# Patient Record
Sex: Female | Born: 1945 | Race: White | Hispanic: No | Smoking: Never smoker
Health system: Southern US, Community
[De-identification: ages and names within clinical notes are randomized; demographics above are authoritative.]

## PROBLEM LIST (undated history)

## (undated) HISTORY — PX: ABDOMINAL HYSTERECTOMY: SHX81

## (undated) HISTORY — PX: APPENDECTOMY: SHX54

## (undated) HISTORY — PX: HERNIA REPAIR: SHX51

---

## 2010-04-30 ENCOUNTER — Inpatient Hospital Stay (HOSPITAL_COMMUNITY)
Admission: EM | Admit: 2010-04-30 | Discharge: 2010-05-07 | Payer: Self-pay | Source: Home / Self Care | Admitting: Emergency Medicine

## 2010-05-01 ENCOUNTER — Ambulatory Visit: Payer: Self-pay | Admitting: Internal Medicine

## 2010-05-01 ENCOUNTER — Ambulatory Visit: Payer: Self-pay | Admitting: Gastroenterology

## 2010-05-02 ENCOUNTER — Ambulatory Visit: Payer: Self-pay | Admitting: Gastroenterology

## 2010-05-11 ENCOUNTER — Encounter (INDEPENDENT_AMBULATORY_CARE_PROVIDER_SITE_OTHER): Payer: Self-pay | Admitting: *Deleted

## 2010-08-04 NOTE — Letter (Signed)
Summary: RAD DG UGI W/KUB  RAD DG UGI W/KUB   Imported By: Rexene Alberts 05/01/2010 16:07:30  _____________________________________________________________________  External Attachment:    Type:   Image     Comment:   External Document  Appended Document: RAD DG UGI W/KUB pt aware; surgery consult obtained - in pt

## 2010-08-04 NOTE — Miscellaneous (Signed)
Summary: CONSULTATION  Clinical Lists Changes  NAME:  Denise Pham, Denise Pham               ACCOUNT NO.:  [id number]      MEDICAL RECORD NO.:  [id number]          PATIENT TYPE:  INP      LOCATION:  A334                          FACILITY:  [facility]      PHYSICIAN:  R. Roetta Sessions, M.D. DATE OF BIRTH:  [birth year]      DATE OF CONSULTATION:  05/01/2010   DATE OF DISCHARGE:                                    CONSULTATION         REASON FOR CONSULTATION:  Nausea, vomiting, and hematemesis.      PRIMARY CARE PHYSICIAN:  Dr. Fara Chute      HISTORY OF PRESENT ILLNESS:  The patient is very pleasant 65 year old   lady who presented to the emergency department here at [facility] with complaints of protracted nausea and vomiting and coffee-   ground emesis.  She really has no significant past medical history.   Wednesday she was eating out at a restaurant with her daughter and had   some salad.  When she started eating the pizza, she had an episode of   nausea and vomiting.  She excused herself to the bathroom.  She tried to   return and continue eating, but the vomiting persisted.  She went home   and because of persistent symptoms, she actually presented to [facility] late Wednesday evening.  According to her daughter,   who provides much of the history due to the patient being acutely ill,   she had a CT of the abdomen and pelvis which showed a hiatal hernia, but   nothing other significant.  She was given IV antiemetics and pain   medication and sent home.  Her symptoms were controlled for a few hours,   but she began having protracted abdominal pain, nausea, and vomiting   again.  Yesterday afternoon she started having coffee-ground emesis   which was described as profuse watery black emesis.  Her daughter   brought her back to the emergency department here at [facility]   around 4:30 yesterday afternoon.  She describes having pain   retrosternally.  It is  above the xiphoid process.  She has had   significant coffee-ground emesis this morning.  In the past, she has had   episodes of vomiting up food after the first couple of bites, but has   been able to complete her meal.  She really denies having any   significant problems over the last few years, however.  The last few   days, she has had severe GERD.  On occasion she takes Tums as an   outpatient.  She denies any melena or rectal bleeding.  She has not had   a bowel movement in a couple of days.  Generally has a daily BM.  Denies   any fever or chills.  No one else became ill.      OUTPATIENT MEDICATIONS:  Denies any chronic meds.  She was given   something for pain and nausea  at the ED at [facility].      ALLERGIES:  No known drug allergies.      PAST MEDICAL HISTORY:  Hiatal hernia.  No prior EGD or colonoscopy.      PAST SURGICAL HISTORY:  Appendectomy at age 65, hysterectomy in 1989.      FAMILY HISTORY:  Mother is deceased, had history of COPD and CAD.   Father is living, has history of A-fib and CHF.      SOCIAL HISTORY:  She is widowed.  She is a retired Emergency planning/management officer for   [employer].  She has 1 daughter.  She denies any tobacco or alcohol use.      REVIEW OF SYSTEMS:  See HPI for GI.  CONSTITUTIONAL:  No significant   weight loss.  CARDIOPULMONARY:  No chest pain, shortness of breath,   palpitations, or cough.  GENITOURINARY:  No dysuria or hematuria.      PHYSICAL EXAMINATION:  VITAL SIGNS:  Temperature 98.8, pulse 83,   respirations 18, blood pressure 166/80, weight 78.8 kg, height 60   inches.   GENERAL:  Pleasant, acutely ill-appearing Caucasian female in no acute   distress.  She is accompanied by her daughter.  She is able to answer   some questions, but is significantly nauseated and refrains from long   responses.   HEENT:  Sclerae are nonicteric.  Oropharyngeal mucosa dry.   CHEST:  Lungs are clear auscultation.   CARDIAC:  Reveals a regular rate  and rhythm.  No murmurs, rubs, or   gallops.   ABDOMEN:  Obese, positive bowel sounds, soft, really no significant   abdominal tenderness.  No rebound or guarding.  No abdominal bruits or   hernias.   LOWER EXTREMITIES:  No edema.      LABORATORY DATA:  Urinalysis showed ketones 40, trace blood.  Lipase 33,   total bilirubin 0.9, alk phos 77, AST 22, ALT 17, albumin 4, sodium 140,   potassium 3.7, BUN 11, creatinine 0.65, glucose 118.  White count   yesterday was 18,800, today is 15,000, hemoglobin yesterday was 14.2,   now 12.1, MCV today is 70, platelets 253,000.  Chest x-ray showed large   hiatal hernia, left basilar opacities, questionable atelectasis versus   airspace disease.      IMPRESSION:  The patient is a very pleasant 65 year old lady who   presents with history of protracted nausea and vomiting which began   after a meal on Wednesday.  She developed coffee-ground emesis.  She is   now known to have a large hiatal hernia.  Really denies any chronic   symptoms from this.  She complains mostly of retrosternal pain, although   states that it is controlled at this point.  She continues have vomiting   of a significant amount of black watery emesis.  Would question if food-   borne illness has exacerbated the large hiatal hernia that she has had.   Need to consider gastric volvulus or incarceration of the large hiatal   hernia.  Bleeding may be due to Mallory-Weiss tear or ischemic process.   Please note her gallbladder remains in situ, so need to keep that as a   consideration as well.  I have discussed this case with Dr. Derenda Mis.  Currently the patient is being covered with Zosyn for possible   aspiration pneumonia in the setting of protracted nausea and vomiting.   Plans are for nasogastric tube placement to provide the  patient with   some immediate relief.      RECOMMENDATIONS:   1. EGD today.   2. NG tube as discussed with Dr. Ladona Ridgel.   3. Continue IV Protonix.    4. Will go ahead and schedule Zofran over the next 24 hours as well.   5. Retrieve CT abdomen and pelvis report from [place].      I would like to thank Dr. Derenda Mis for allowing [place] to take part in   the care of this patient.               Tana Coast, P.A.         ______________________________   R. Roetta Sessions, M.D.            LL/MEDQ  D:  05/01/2010  T:  05/01/2010  Job:  [id number]      cc:   Fara Chute   Fax: [phone]      [facility]      Melissa L. Ladona Ridgel, MD      Electronically Signed by Tana Coast P.A. on 05/01/2010 02:58:40 PM   Electronically Signed by Lorrin Goodell M.D. on 05/11/2010 11:22:18 AM

## 2010-09-15 LAB — DIFFERENTIAL
Basophils Absolute: 0 10*3/uL (ref 0.0–0.1)
Basophils Absolute: 0.1 10*3/uL (ref 0.0–0.1)
Basophils Relative: 0 % (ref 0–1)
Basophils Relative: 0 % (ref 0–1)
Basophils Relative: 1 % (ref 0–1)
Eosinophils Absolute: 0.1 10*3/uL (ref 0.0–0.7)
Eosinophils Absolute: 0.8 10*3/uL — ABNORMAL HIGH (ref 0.0–0.7)
Eosinophils Relative: 1 % (ref 0–5)
Eosinophils Relative: 3 % (ref 0–5)
Lymphs Abs: 0.9 10*3/uL (ref 0.7–4.0)
Monocytes Absolute: 1.3 10*3/uL — ABNORMAL HIGH (ref 0.1–1.0)
Neutro Abs: 10.1 10*3/uL — ABNORMAL HIGH (ref 1.7–7.7)
Neutrophils Relative %: 65 % (ref 43–77)
Neutrophils Relative %: 83 % — ABNORMAL HIGH (ref 43–77)

## 2010-09-15 LAB — BASIC METABOLIC PANEL
BUN: 5 mg/dL — ABNORMAL LOW (ref 6–23)
BUN: 5 mg/dL — ABNORMAL LOW (ref 6–23)
CO2: 23 mEq/L (ref 19–32)
CO2: 26 mEq/L (ref 19–32)
Calcium: 8.1 mg/dL — ABNORMAL LOW (ref 8.4–10.5)
Calcium: 8.1 mg/dL — ABNORMAL LOW (ref 8.4–10.5)
Chloride: 102 mEq/L (ref 96–112)
Creatinine, Ser: 0.55 mg/dL (ref 0.4–1.2)
Creatinine, Ser: 0.59 mg/dL (ref 0.4–1.2)
Creatinine, Ser: 0.72 mg/dL (ref 0.4–1.2)
GFR calc Af Amer: >60 mL/min (ref >60)
GFR calc non Af Amer: >60 mL/min (ref >60)
GFR calc non Af Amer: >60 mL/min (ref >60)
Glucose, Bld: 105 mg/dL — ABNORMAL HIGH (ref 70–99)
Glucose, Bld: 109 mg/dL — ABNORMAL HIGH (ref 70–99)
Glucose, Bld: 86 mg/dL (ref 70–99)
Potassium: 3.3 mEq/L — ABNORMAL LOW (ref 3.5–5.1)

## 2010-09-15 LAB — CBC
HCT: 31.5 % — ABNORMAL LOW (ref 36.0–46.0)
MCH: 29.4 pg (ref 26.0–34.0)
MCH: 29.8 pg (ref 26.0–34.0)
MCHC: 34.2 g/dL (ref 30.0–36.0)
MCHC: 34.2 g/dL (ref 30.0–36.0)
MCHC: 34.7 g/dL (ref 30.0–36.0)
MCV: 85.9 fL (ref 78.0–100.0)
MCV: 86.3 fL (ref 78.0–100.0)
Platelets: 205 10*3/uL (ref 150–400)
Platelets: 233 10*3/uL (ref 150–400)
Platelets: 239 10*3/uL (ref 150–400)
RBC: 3.93 MIL/uL (ref 3.87–5.11)
RDW: 12.6 % (ref 11.5–15.5)
RDW: 12.6 % (ref 11.5–15.5)
RDW: 12.7 % (ref 11.5–15.5)
WBC: 12.8 10*3/uL — ABNORMAL HIGH (ref 4.0–10.5)

## 2010-09-15 LAB — MAGNESIUM
Magnesium: 1.7 mg/dL (ref 1.5–2.5)
Magnesium: 1.8 mg/dL (ref 1.5–2.5)
Magnesium: 2.1 mg/dL (ref 1.5–2.5)

## 2010-09-15 LAB — PHOSPHORUS
Phosphorus: 1.6 mg/dL — ABNORMAL LOW (ref 2.3–4.6)
Phosphorus: 2.4 mg/dL (ref 2.3–4.6)

## 2010-09-16 LAB — BASIC METABOLIC PANEL
BUN: 11 mg/dL (ref 6–23)
BUN: 11 mg/dL (ref 6–23)
BUN: 17 mg/dL (ref 6–23)
CO2: 19 mEq/L (ref 19–32)
CO2: 21 mEq/L (ref 19–32)
Calcium: 8.2 mg/dL — ABNORMAL LOW (ref 8.4–10.5)
Chloride: 100 mEq/L (ref 96–112)
Chloride: 105 mEq/L (ref 96–112)
Creatinine, Ser: 0.58 mg/dL (ref 0.4–1.2)
Creatinine, Ser: 0.65 mg/dL (ref 0.4–1.2)
Creatinine, Ser: 0.68 mg/dL (ref 0.4–1.2)
GFR calc Af Amer: >60 mL/min (ref >60)
GFR calc Af Amer: >60 mL/min (ref >60)
GFR calc non Af Amer: >60 mL/min (ref >60)
GFR calc non Af Amer: >60 mL/min (ref >60)
GFR calc non Af Amer: >60 mL/min (ref >60)
Glucose, Bld: 118 mg/dL — ABNORMAL HIGH (ref 70–99)
Glucose, Bld: 69 mg/dL — ABNORMAL LOW (ref 70–99)
Potassium: 3.1 mEq/L — ABNORMAL LOW (ref 3.5–5.1)
Potassium: 3.1 mEq/L — ABNORMAL LOW (ref 3.5–5.1)
Potassium: 3.7 mEq/L (ref 3.5–5.1)
Sodium: 136 mEq/L (ref 135–145)

## 2010-09-16 LAB — COMPREHENSIVE METABOLIC PANEL
ALT: 15 U/L (ref 0–35)
ALT: 17 U/L (ref 0–35)
AST: 15 U/L (ref 0–37)
CO2: 24 mEq/L (ref 19–32)
Calcium: 8 mg/dL — ABNORMAL LOW (ref 8.4–10.5)
Calcium: 9.4 mg/dL (ref 8.4–10.5)
Chloride: 104 mEq/L (ref 96–112)
GFR calc Af Amer: >60 mL/min (ref >60)
GFR calc non Af Amer: >60 mL/min (ref >60)
Glucose, Bld: 120 mg/dL — ABNORMAL HIGH (ref 70–99)
Sodium: 136 mEq/L (ref 135–145)
Sodium: 140 mEq/L (ref 135–145)
Total Protein: 7.6 g/dL (ref 6.0–8.3)

## 2010-09-16 LAB — DIFFERENTIAL
Basophils Absolute: 0.1 10*3/uL (ref 0.0–0.1)
Basophils Absolute: 0.2 10*3/uL — ABNORMAL HIGH (ref 0.0–0.1)
Basophils Absolute: 0.2 10*3/uL — ABNORMAL HIGH (ref 0.0–0.1)
Basophils Relative: 1 % (ref 0–1)
Basophils Relative: 1 % (ref 0–1)
Basophils Relative: 2 % — ABNORMAL HIGH (ref 0–1)
Eosinophils Absolute: 0 10*3/uL (ref 0.0–0.7)
Eosinophils Absolute: 0.5 10*3/uL (ref 0.0–0.7)
Eosinophils Relative: 1 % (ref 0–5)
Eosinophils Relative: 4 % (ref 0–5)
Eosinophils Relative: 5 % (ref 0–5)
Lymphocytes Relative: 15 % (ref 12–46)
Lymphs Abs: 1.8 10*3/uL (ref 0.7–4.0)
Lymphs Abs: 1.9 10*3/uL (ref 0.7–4.0)
Monocytes Absolute: 0.9 10*3/uL (ref 0.1–1.0)
Monocytes Absolute: 1.1 10*3/uL — ABNORMAL HIGH (ref 0.1–1.0)
Monocytes Absolute: 1.3 10*3/uL — ABNORMAL HIGH (ref 0.1–1.0)
Monocytes Relative: 10 % (ref 3–12)
Monocytes Relative: 8 % (ref 3–12)
Monocytes Relative: 8 % (ref 3–12)
Neutro Abs: 15.4 10*3/uL — ABNORMAL HIGH (ref 1.7–7.7)
Neutro Abs: 8.8 10*3/uL — ABNORMAL HIGH (ref 1.7–7.7)
Neutrophils Relative %: 69 % (ref 43–77)
Neutrophils Relative %: 82 % — ABNORMAL HIGH (ref 43–77)

## 2010-09-16 LAB — PHOSPHORUS: Phosphorus: 2.3 mg/dL (ref 2.3–4.6)

## 2010-09-16 LAB — CBC
HCT: 35.4 % — ABNORMAL LOW (ref 36.0–46.0)
HCT: 35.4 % — ABNORMAL LOW (ref 36.0–46.0)
HCT: 41.8 % (ref 36.0–46.0)
Hemoglobin: 11.6 g/dL — ABNORMAL LOW (ref 12.0–15.0)
MCH: 29.5 pg (ref 26.0–34.0)
MCHC: 34 g/dL (ref 30.0–36.0)
MCHC: 34.3 g/dL (ref 30.0–36.0)
MCHC: 34.5 g/dL (ref 30.0–36.0)
MCHC: 34.6 g/dL (ref 30.0–36.0)
MCV: 85.5 fL (ref 78.0–100.0)
MCV: 86.2 fL (ref 78.0–100.0)
Platelets: 378 10*3/uL (ref 150–400)
RBC: 3.87 MIL/uL (ref 3.87–5.11)
RDW: 12.4 % (ref 11.5–15.5)
RDW: 12.4 % (ref 11.5–15.5)
RDW: 12.9 % (ref 11.5–15.5)

## 2010-09-16 LAB — URINE CULTURE: Culture: NO GROWTH

## 2010-09-16 LAB — URINALYSIS, ROUTINE W REFLEX MICROSCOPIC
Bilirubin Urine: NEGATIVE
Ketones, ur: 40 mg/dL — AB
Nitrite: NEGATIVE
Protein, ur: NEGATIVE mg/dL
Urobilinogen, UA: 0.2 mg/dL (ref 0.0–1.0)

## 2010-09-16 LAB — CROSSMATCH: Unit division: 0

## 2010-09-16 LAB — HEMOGLOBIN AND HEMATOCRIT, BLOOD
HCT: 39.1 % (ref 36.0–46.0)
Hemoglobin: 13.2 g/dL (ref 12.0–15.0)

## 2010-09-16 LAB — URINE MICROSCOPIC-ADD ON

## 2010-09-16 LAB — ABO/RH: ABO/RH(D): A POS

## 2010-09-16 LAB — MAGNESIUM: Magnesium: 2.2 mg/dL (ref 1.5–2.5)

## 2011-08-27 DIAGNOSIS — J0100 Acute maxillary sinusitis, unspecified: Secondary | ICD-10-CM | POA: Diagnosis not present

## 2011-09-08 DIAGNOSIS — Z1231 Encounter for screening mammogram for malignant neoplasm of breast: Secondary | ICD-10-CM | POA: Diagnosis not present

## 2012-03-13 ENCOUNTER — Encounter (HOSPITAL_COMMUNITY): Payer: Self-pay | Admitting: Emergency Medicine

## 2012-03-13 ENCOUNTER — Emergency Department (HOSPITAL_COMMUNITY)
Admission: EM | Admit: 2012-03-13 | Discharge: 2012-03-14 | Disposition: A | Discharge status: Home / Self Care | Payer: Medicare Other | Attending: Emergency Medicine | Admitting: Emergency Medicine

## 2012-03-13 DIAGNOSIS — R339 Retention of urine, unspecified: Secondary | ICD-10-CM | POA: Insufficient documentation

## 2012-03-13 DIAGNOSIS — R112 Nausea with vomiting, unspecified: Secondary | ICD-10-CM | POA: Insufficient documentation

## 2012-03-13 DIAGNOSIS — N200 Calculus of kidney: Secondary | ICD-10-CM | POA: Diagnosis not present

## 2012-03-13 DIAGNOSIS — N201 Calculus of ureter: Secondary | ICD-10-CM | POA: Diagnosis not present

## 2012-03-13 DIAGNOSIS — R1032 Left lower quadrant pain: Secondary | ICD-10-CM | POA: Insufficient documentation

## 2012-03-13 DIAGNOSIS — R1110 Vomiting, unspecified: Secondary | ICD-10-CM | POA: Diagnosis not present

## 2012-03-13 DIAGNOSIS — N1330 Unspecified hydronephrosis: Secondary | ICD-10-CM | POA: Insufficient documentation

## 2012-03-13 NOTE — ED Notes (Signed)
Patient presents to ER with c/o left flank pain that started at 2pm today.  States pain has gotten worse throughout the evening and is now going down into her groin.  Patient began vomiting around 8:30pm tonight.  Daughter states that emesis looks like coffee grounds.  Patient A&O; skin w/d. Respirations even and unlabored; able to speak in complete sentences without difficulty.

## 2012-03-13 NOTE — ED Notes (Signed)
Patient also states she has been "dribbling" when she voids.

## 2012-03-14 ENCOUNTER — Emergency Department (HOSPITAL_COMMUNITY): Payer: Medicare Other

## 2012-03-14 DIAGNOSIS — N201 Calculus of ureter: Secondary | ICD-10-CM | POA: Diagnosis not present

## 2012-03-14 LAB — URINALYSIS, ROUTINE W REFLEX MICROSCOPIC
Leukocytes, UA: NEGATIVE
Nitrite: NEGATIVE
Protein, ur: NEGATIVE mg/dL
Specific Gravity, Urine: 1.025 (ref 1.005–1.030)
Urobilinogen, UA: 0.2 mg/dL (ref 0.0–1.0)

## 2012-03-14 LAB — URINE MICROSCOPIC-ADD ON

## 2012-03-14 MED ORDER — SODIUM CHLORIDE 0.9 % IV SOLN
Freq: Once | INTRAVENOUS | Status: AC
  Administered 2012-03-14: 01:00:00 via INTRAVENOUS

## 2012-03-14 MED ORDER — KETOROLAC TROMETHAMINE 30 MG/ML IJ SOLN
30.0000 mg | Freq: Once | INTRAMUSCULAR | Status: AC
  Administered 2012-03-14: 30 mg via INTRAVENOUS
  Filled 2012-03-14: qty 1

## 2012-03-14 MED ORDER — CIPROFLOXACIN HCL 500 MG PO TABS: 250.0000 mg | ORAL_TABLET | Freq: Two times a day (BID) | ORAL | Status: AC

## 2012-03-14 MED ORDER — HYDROMORPHONE HCL PF 1 MG/ML IJ SOLN
1.0000 mg | Freq: Once | INTRAMUSCULAR | Status: AC
  Administered 2012-03-14: 1 mg via INTRAVENOUS
  Filled 2012-03-14: qty 1

## 2012-03-14 MED ORDER — ONDANSETRON HCL 4 MG PO TABS: 4.0000 mg | ORAL_TABLET | Freq: Four times a day (QID) | ORAL | Status: AC

## 2012-03-14 MED ORDER — HYDROCODONE-ACETAMINOPHEN 5-325 MG PO TABS: 1.0000 | ORAL_TABLET | ORAL | Status: AC | PRN

## 2012-03-14 MED ORDER — ONDANSETRON HCL 4 MG/2ML IJ SOLN
4.0000 mg | Freq: Once | INTRAMUSCULAR | Status: AC
  Administered 2012-03-14: 4 mg via INTRAVENOUS
  Filled 2012-03-14: qty 2

## 2012-03-14 MED ORDER — CIPROFLOXACIN HCL 250 MG PO TABS
500.0000 mg | ORAL_TABLET | Freq: Once | ORAL | Status: AC
  Administered 2012-03-14: 500 mg via ORAL
  Filled 2012-03-14: qty 2

## 2012-03-14 NOTE — ED Provider Notes (Signed)
History     CSN: [id number]  Arrival date & time 03/13/12  2300   First MD Initiated Contact with Patient 03/14/12 0008      Chief Complaint  Patient presents with  . Flank Pain  . Emesis  . Urinary Retention    (Consider location/radiation/quality/duration/timing/severity/associated sxs/prior treatment) HPI  Denise Pham is a 66 y.o. female who presents to the Emergency Department complaining of left flank pain with radiation to the left lower abdomen associated with nausea, vomiting and difficulty urinating. Pain began at 2 PM and vomiting started at 8:30 PM.She feels a pressure in the suprapubic area and an urge to push with no results.She has had one previous experience similar to this last year. She did not seek medical treatment at that time. Denies fever, chills, diarrhea.  PCP  Dr. Neita Carp     History reviewed. No pertinent past medical history.  Past Surgical History  Procedure Date  . Appendectomy   . Hernia repair   . Abdominal hysterectomy     No family history on file.  History  Substance Use Topics  . Smoking status: Never Smoker   . Smokeless tobacco: Not on file  . Alcohol Use: No    OB History    Grav Para Term Preterm Abortions TAB SAB Ect Mult Living                  Review of Systems  Constitutional: Negative for fever.       10 Systems reviewed and are negative for acute change except as noted in the HPI.  HENT: Negative for congestion.   Eyes: Negative for discharge and redness.  Respiratory: Negative for cough and shortness of breath.   Cardiovascular: Negative for chest pain.  Gastrointestinal: Positive for nausea and vomiting. Negative for abdominal pain.  Genitourinary: Positive for urgency, flank pain, difficulty urinating and pelvic pain.  Musculoskeletal: Negative for back pain.  Skin: Negative for rash.  Neurological: Negative for syncope, numbness and headaches.  Psychiatric/Behavioral:       No behavior change.     Allergies  Review of patient's allergies indicates no known allergies.  Home Medications  No current outpatient prescriptions on file.  BP 150/89  Pulse 82  Temp 98.2 F (36.8 C) (Oral)  Resp 20  Ht 5' (1.524 m)  Wt 165 lb (74.844 kg)  BMI 32.22 kg/m2  SpO2 97%  Physical Exam  Nursing note and vitals reviewed. Constitutional: She is oriented to person, place, and time. She appears well-developed and well-nourished.       Awake, alert, nontoxic appearance.  HENT:  Head: Atraumatic.  Eyes: Right eye exhibits no discharge. Left eye exhibits no discharge.  Neck: Neck supple.  Cardiovascular: Normal heart sounds.   Pulmonary/Chest: Effort normal and breath sounds normal. She exhibits no tenderness.  Abdominal: Soft. There is no tenderness. There is no rebound.       Mild suprapubic tenderness  Genitourinary:       Mild left cva tenderness to percussion  Musculoskeletal: She exhibits no tenderness.       Baseline ROM, no obvious new focal weakness.  Neurological: She is alert and oriented to person, place, and time.       Mental status and motor strength appears baseline for patient and situation.  Skin: No rash noted.  Psychiatric: She has a normal mood and affect.    ED Course  Procedures (including critical care time) Results for orders placed during the hospital encounter of  03/13/12  URINALYSIS, ROUTINE W REFLEX MICROSCOPIC      Component Value Range   Color, Urine YELLOW  YELLOW   APPearance CLEAR  CLEAR   Specific Gravity, Urine 1.025  1.005 - 1.030   pH 6.0  5.0 - 8.0   Glucose, UA NEGATIVE  NEGATIVE mg/dL   Hgb urine dipstick LARGE (*) NEGATIVE   Bilirubin Urine NEGATIVE  NEGATIVE   Ketones, ur 15 (*) NEGATIVE mg/dL   Protein, ur NEGATIVE  NEGATIVE mg/dL   Urobilinogen, UA 0.2  0.0 - 1.0 mg/dL   Nitrite NEGATIVE  NEGATIVE   Leukocytes, UA NEGATIVE  NEGATIVE  URINE MICROSCOPIC-ADD ON      Component Value Range   Squamous Epithelial / LPF RARE  RARE    WBC, UA 0-2  <3 WBC/hpf   RBC / HPF 21-50  <3 RBC/hpf   Bacteria, UA FEW (*) RARE   Urine-Other MUCOUS PRESENT      Ct Abdomen Pelvis Wo Contrast  03/14/2012  *RADIOLOGY REPORT*  Clinical Data: Left flank pain.  Coffee-ground emesis.  Urinary retention.  Previous hysterectomy, appendectomy and hernia repair.  CT ABDOMEN AND PELVIS WITHOUT CONTRAST  Technique:  Multidetector CT imaging of the abdomen and pelvis was performed following the standard protocol without intravenous contrast.  Comparison: 04/29/2010.  Findings: Moderate sized hiatal hernia.  2 mm distal left ureteral calculus.  Mild dilatation of the left ureter and renal collecting system with mild left perinephric soft tissue stranding.  Normal appearing right kidney, right ureter and urinary bladder.  No additional calculi are seen.  Multiple colonic diverticula without evidence of diverticulitis. Surgically absent appendix.  No small bowel abnormalities seen.  No enlarged lymph nodes.  Surgically absent uterus and ovaries.  Unremarkable noncontrasted appearance of the liver, spleen, pancreas, gallbladder and adrenal glands.  Clear lung bases.  Lumbar and lower thoracic spine degenerative changes.  IMPRESSION:  1.  2 mm distal left ureteral calculus just proximal to the ureteral vesicle junction.  This is causing mild left hydronephrosis.  This may be faintly visible on the scout image. 2.  Extensive colonic diverticulosis without evidence of diverticulitis. 3.  Moderate-sized hiatal hernia with an interval decrease in size.   Original Report Authenticated By: Darrol Angel, M.D.     No diagnosis found.    MDM  Patient with flank pain with radiation to the left lower abdomen. UA with 21-50 RBCs. CT abd/pelvis with 2 mm distal left ureteral stone and mild hydronephrosis. Patient given analgesic, antiinflammatory and antiemetic with pain relief. Initiated antibiotic treatment. Referral to urology. Dx testing d/w pt and daughter.  Questions  answered.  Verb understanding, agreeable to d/c home with outpt f/u. Pt feels improved after observation and/or treatment in ED.Pt stable in ED with no significant deterioration in condition.The patient appears reasonably screened and/or stabilized for discharge and I doubt any other medical condition or other [facility] requiring further screening, evaluation, or treatment in the ED at this time prior to discharge.  MDM Reviewed: nursing note and vitals Interpretation: labs and CT scan           Nicoletta Dress. Colon Branch, MD 03/14/12 [phone]

## 2012-03-14 NOTE — ED Notes (Signed)
Patient transported to CT 

## 2012-03-16 DIAGNOSIS — N201 Calculus of ureter: Secondary | ICD-10-CM | POA: Diagnosis not present

## 2012-05-10 DIAGNOSIS — Z23 Encounter for immunization: Secondary | ICD-10-CM | POA: Diagnosis not present

## 2012-09-26 DIAGNOSIS — Z1231 Encounter for screening mammogram for malignant neoplasm of breast: Secondary | ICD-10-CM | POA: Diagnosis not present

## 2013-04-07 DIAGNOSIS — Z23 Encounter for immunization: Secondary | ICD-10-CM | POA: Diagnosis not present

## 2013-07-18 DIAGNOSIS — L02519 Cutaneous abscess of unspecified hand: Secondary | ICD-10-CM | POA: Diagnosis not present

## 2013-07-18 DIAGNOSIS — M659 Unspecified synovitis and tenosynovitis, unspecified site: Secondary | ICD-10-CM | POA: Diagnosis not present

## 2013-07-18 DIAGNOSIS — L03119 Cellulitis of unspecified part of limb: Secondary | ICD-10-CM | POA: Diagnosis not present

## 2013-10-18 DIAGNOSIS — Z1231 Encounter for screening mammogram for malignant neoplasm of breast: Secondary | ICD-10-CM | POA: Diagnosis not present

## 2013-12-06 DIAGNOSIS — R059 Cough, unspecified: Secondary | ICD-10-CM | POA: Diagnosis not present

## 2013-12-06 DIAGNOSIS — R05 Cough: Secondary | ICD-10-CM | POA: Diagnosis not present

## 2013-12-06 DIAGNOSIS — J40 Bronchitis, not specified as acute or chronic: Secondary | ICD-10-CM | POA: Diagnosis not present

## 2014-07-01 DIAGNOSIS — Z23 Encounter for immunization: Secondary | ICD-10-CM | POA: Diagnosis not present

## 2014-08-15 DIAGNOSIS — J0100 Acute maxillary sinusitis, unspecified: Secondary | ICD-10-CM | POA: Diagnosis not present

## 2014-10-31 DIAGNOSIS — Z1231 Encounter for screening mammogram for malignant neoplasm of breast: Secondary | ICD-10-CM | POA: Diagnosis not present

## 2015-05-03 DIAGNOSIS — Z23 Encounter for immunization: Secondary | ICD-10-CM | POA: Diagnosis not present

## 2015-12-01 DIAGNOSIS — Z1231 Encounter for screening mammogram for malignant neoplasm of breast: Secondary | ICD-10-CM | POA: Diagnosis not present

## 2016-03-20 ENCOUNTER — Emergency Department (HOSPITAL_COMMUNITY): Payer: Medicare Other

## 2016-03-20 ENCOUNTER — Encounter (HOSPITAL_COMMUNITY): Payer: Self-pay

## 2016-03-20 ENCOUNTER — Emergency Department (HOSPITAL_COMMUNITY)
Admission: EM | Admit: 2016-03-20 | Discharge: 2016-03-21 | Disposition: A | Discharge status: Home / Self Care | Payer: Medicare Other | Attending: Emergency Medicine | Admitting: Emergency Medicine

## 2016-03-20 DIAGNOSIS — G9389 Other specified disorders of brain: Secondary | ICD-10-CM | POA: Diagnosis not present

## 2016-03-20 DIAGNOSIS — Z79899 Other long term (current) drug therapy: Secondary | ICD-10-CM | POA: Diagnosis not present

## 2016-03-20 DIAGNOSIS — M25511 Pain in right shoulder: Secondary | ICD-10-CM

## 2016-03-20 DIAGNOSIS — R251 Tremor, unspecified: Secondary | ICD-10-CM | POA: Diagnosis present

## 2016-03-20 DIAGNOSIS — R079 Chest pain, unspecified: Secondary | ICD-10-CM | POA: Diagnosis not present

## 2016-03-20 LAB — COMPREHENSIVE METABOLIC PANEL
ALT: 15 U/L (ref 14–54)
AST: 17 U/L (ref 15–41)
Albumin: 3.9 g/dL (ref 3.5–5.0)
Alkaline Phosphatase: 61 U/L (ref 38–126)
Anion gap: 6 (ref 5–15)
BILIRUBIN TOTAL: 0.5 mg/dL (ref 0.3–1.2)
BUN: 24 mg/dL — ABNORMAL HIGH (ref 6–20)
CHLORIDE: 110 mmol/L (ref 101–111)
CO2: 23 mmol/L (ref 22–32)
CREATININE: 0.69 mg/dL (ref 0.44–1.00)
Calcium: 8.9 mg/dL (ref 8.9–10.3)
Glucose, Bld: 100 mg/dL — ABNORMAL HIGH (ref 65–99)
POTASSIUM: 3.4 mmol/L — AB (ref 3.5–5.1)
Sodium: 139 mmol/L (ref 135–145)
TOTAL PROTEIN: 6.8 g/dL (ref 6.5–8.1)

## 2016-03-20 LAB — CBC WITH DIFFERENTIAL/PLATELET
Basophils Absolute: 0 10*3/uL (ref 0.0–0.1)
Basophils Relative: 1 %
EOS PCT: 4 %
Eosinophils Absolute: 0.3 10*3/uL (ref 0.0–0.7)
HCT: 40.9 % (ref 36.0–46.0)
Hemoglobin: 14.2 g/dL (ref 12.0–15.0)
LYMPHS ABS: 2.6 10*3/uL (ref 0.7–4.0)
LYMPHS PCT: 31 %
MCH: 30.3 pg (ref 26.0–34.0)
MCHC: 34.7 g/dL (ref 30.0–36.0)
MCV: 87.2 fL (ref 78.0–100.0)
MONO ABS: 0.8 10*3/uL (ref 0.1–1.0)
MONOS PCT: 10 %
Neutro Abs: 4.6 10*3/uL (ref 1.7–7.7)
Neutrophils Relative %: 54 %
PLATELETS: 245 10*3/uL (ref 150–400)
RBC: 4.69 MIL/uL (ref 3.87–5.11)
RDW: 12.8 % (ref 11.5–15.5)
WBC: 8.4 10*3/uL (ref 4.0–10.5)

## 2016-03-20 LAB — TROPONIN I

## 2016-03-20 NOTE — ED Provider Notes (Signed)
[facility]-EMERGENCY DEPT Provider Note   CSN: [id number] Arrival date & time: 03/20/16  2113  By signing my name below, I, Javier Docker, attest that this documentation has been prepared under the direction and in the presence of Edward Qualia, MD. Electronically Signed: Javier Docker, ER Scribe. 02/14/2016. 9:43 PM.  History   Chief Complaint Chief Complaint  Patient presents with  . Chest Pain   HPI  HPI Comments: Denise Pham is a 70 y.o. female who presents to the Emergency Department complaining of  hyperventilation and diffuse bilateral upper and lower extremity tremor that started one hour ago. Her sx are completely resolved at this time. She states her sx started with right shoulder pain, and she took three ibuprofen and laid down. She then started having a tremor followed by hyperventilation. She denies unilateral weakness, difficulty speaking, vision changes, or HA.    History reviewed. No pertinent past medical history.  There are no active problems to display for this patient.   Past Surgical History:  Procedure Laterality Date  . ABDOMINAL HYSTERECTOMY    . APPENDECTOMY    . HERNIA REPAIR      OB History    No data available       Home Medications    Prior to Admission medications   Medication Sig Start Date End Date Taking? Authorizing Provider  ibuprofen (ADVIL,MOTRIN) 200 MG tablet Take 600 mg by mouth daily as needed for mild pain or moderate pain.   Yes Historical Provider, MD    Family History History reviewed. No pertinent family history.  Social History Social History  Substance Use Topics  . Smoking status: Never Smoker  . Smokeless tobacco: Never Used  . Alcohol use No     Allergies   Tdap [diphth-acell pertussis-tetanus]   Review of Systems Review of Systems  Constitutional: Negative for chills, diaphoresis and fever.  Eyes: Negative for visual disturbance.  Respiratory: Negative for chest tightness.   Cardiovascular:  Negative for chest pain and palpitations.  Neurological: Negative for seizures, syncope, light-headedness and headaches.  Psychiatric/Behavioral: The patient is not nervous/anxious.   All other systems reviewed and are negative.    Physical Exam Updated Vital Signs BP 152/62 (BP Location: Right Arm)   Pulse 68   Temp 98.5 F (36.9 C) (Oral)   Resp 18   Ht 5' (1.524 m)   Wt 160 lb (72.6 kg)   SpO2 98%   BMI 31.25 kg/m   Physical Exam  Constitutional: She is oriented to person, place, and time. She appears well-developed and well-nourished. No distress.  HENT:  Head: Normocephalic and atraumatic.  Eyes: Pupils are equal, round, and reactive to light.  Neck: Neck supple.  Cardiovascular: Normal rate.   Pulmonary/Chest: Effort normal. No respiratory distress.  Musculoskeletal: Normal range of motion.  Neurological: She is alert and oriented to person, place, and time. Coordination normal.  No tremor  Skin: Skin is warm and dry. She is not diaphoretic.  Psychiatric: She has a normal mood and affect. Her behavior is normal.  Nursing note and vitals reviewed.    ED Treatments / Results  DIAGNOSTIC STUDIES: Oxygen Saturation is 100% on RA, normal by my interpretation.    COORDINATION OF CARE: 9:38 PM Discussed treatment plan with pt at bedside which includes troponin and EKG and pt agreed to plan.  Labs (all labs ordered are listed, but only abnormal results are displayed) Labs Reviewed  COMPREHENSIVE METABOLIC PANEL - Abnormal; Notable for the following:  Result Value   Potassium 3.4 (*)    Glucose, Bld 100 (*)    BUN 24 (*)    All other components within normal limits  CBC WITH DIFFERENTIAL/PLATELET  TROPONIN I    EKG  EKG Interpretation  Date/Time:  Saturday March 20 2016 21:26:27 EDT Ventricular Rate:  77 PR Interval:  170 QRS Duration: 88 QT Interval:  396 QTC Calculation: 448 R Axis:   -6 Text Interpretation:  Normal sinus rhythm Normal ECG  since last tracing no significant change Confirmed by Hyacinth MeekerMILLER  MD, Shuaib Corsino ([id number]) on 03/20/2016 11:38:05 PM       Radiology Dg Chest 2 View  Result Date: 03/20/2016 CLINICAL DATA:  Acute onset of generalized chest pain and shortness of breath. Recent falls. Shaking. Initial encounter. EXAM: CHEST  2 VIEW COMPARISON:  Chest radiograph performed 06/04/2010 FINDINGS: The lungs are well-aerated. Minimal left basilar atelectasis or scarring is noted. There is no evidence of pleural effusion or pneumothorax. The heart is borderline normal in size. No acute osseous abnormalities are seen. IMPRESSION: Minimal left basilar atelectasis or scarring noted. Lungs otherwise clear. Electronically Signed   By: Roanna RaiderJeffery  Chang M.D.   On: 03/20/2016 22:23   Ct Head Wo Contrast  Result Date: 03/20/2016 CLINICAL DATA:  BILATERAL upper and lower extremity tremor EXAM: CT HEAD WITHOUT CONTRAST TECHNIQUE: Contiguous axial images were obtained from the base of the skull through the vertex without intravenous contrast. COMPARISON:  None FINDINGS: Mild atrophy. Normal ventricular morphology. No midline shift or mass effect. Mild small vessel chronic ischemic changes of deep cerebral white matter. No intracranial hemorrhage, mass lesion or evidence acute infarction. No extra-axial fluid collections. Bones and sinuses unremarkable. IMPRESSION: Atrophy with small vessel chronic ischemic changes of deep cerebral white matter. No acute intracranial abnormalities. Electronically Signed   By: Ulyses SouthwardMark  Boles M.D.   On: 03/20/2016 22:28    Procedures Procedures (including critical care time)  Medications Ordered in ED Medications - No data to display   Initial Impression / Assessment and Plan / ED Course  I have reviewed the triage vital signs and the nursing notes.  Pertinent labs & imaging results that were available during my care of the patient were reviewed by me and considered in my medical decision making (see chart for  details).  Clinical Course    Normal CT, CXR and labs - no acute findings  stable for d/c No recurrent sx, no signs of ACS  Final Clinical Impressions(s) / ED Diagnoses   Final diagnoses:  Shoulder pain, acute, right    New Prescriptions New Prescriptions   No medications on file     I personally performed the services described in this documentation, which was scribed in my presence. The recorded information has been reviewed and is accurate.           Eber HongBrian Karman [place], MD 03/20/16 [phone]

## 2016-03-20 NOTE — ED Notes (Signed)
Patient transported to CT 

## 2016-03-20 NOTE — ED Triage Notes (Signed)
C/o shortness of breath and chest pain. Family member states recent falls. Patient states "im shaking, I cant get control of my body" patient is repeating herself in triage. A&OX4 at this time.

## 2016-03-24 DIAGNOSIS — Z23 Encounter for immunization: Secondary | ICD-10-CM | POA: Diagnosis not present

## 2016-03-24 DIAGNOSIS — R251 Tremor, unspecified: Secondary | ICD-10-CM | POA: Diagnosis not present

## 2016-03-24 DIAGNOSIS — Z6830 Body mass index (BMI) 30.0-30.9, adult: Secondary | ICD-10-CM | POA: Diagnosis not present

## 2016-04-19 DIAGNOSIS — K649 Unspecified hemorrhoids: Secondary | ICD-10-CM | POA: Diagnosis not present

## 2016-04-19 DIAGNOSIS — Z6830 Body mass index (BMI) 30.0-30.9, adult: Secondary | ICD-10-CM | POA: Diagnosis not present

## 2016-12-14 DIAGNOSIS — Z1231 Encounter for screening mammogram for malignant neoplasm of breast: Secondary | ICD-10-CM | POA: Diagnosis not present

## 2017-06-02 DIAGNOSIS — Z23 Encounter for immunization: Secondary | ICD-10-CM | POA: Diagnosis not present

## 2017-11-14 DIAGNOSIS — Z6830 Body mass index (BMI) 30.0-30.9, adult: Secondary | ICD-10-CM | POA: Diagnosis not present

## 2017-11-14 DIAGNOSIS — J019 Acute sinusitis, unspecified: Secondary | ICD-10-CM | POA: Diagnosis not present

## 2017-11-14 DIAGNOSIS — J209 Acute bronchitis, unspecified: Secondary | ICD-10-CM | POA: Diagnosis not present

## 2017-12-22 DIAGNOSIS — Z1231 Encounter for screening mammogram for malignant neoplasm of breast: Secondary | ICD-10-CM | POA: Diagnosis not present

## 2018-04-26 DIAGNOSIS — Z23 Encounter for immunization: Secondary | ICD-10-CM | POA: Diagnosis not present

## 2018-06-29 IMAGING — CT CT HEAD W/O CM
3 of 4 series · 16 of 47 positions shown, 19 images · non-contrast
Comparison: None

CLINICAL DATA: BILATERAL upper and lower extremity tremor

EXAM:
CT HEAD WITHOUT CONTRAST
TECHNIQUE: Contiguous axial images were obtained from the base of the skull
through the vertex without intravenous contrast.

[Series 2: head w/o · axial · non-contrast · 0.39mm/px · z∈[+1198,+1322]mm · 10 of 37 slices shown, 13 images]
[im 3/37  brain]
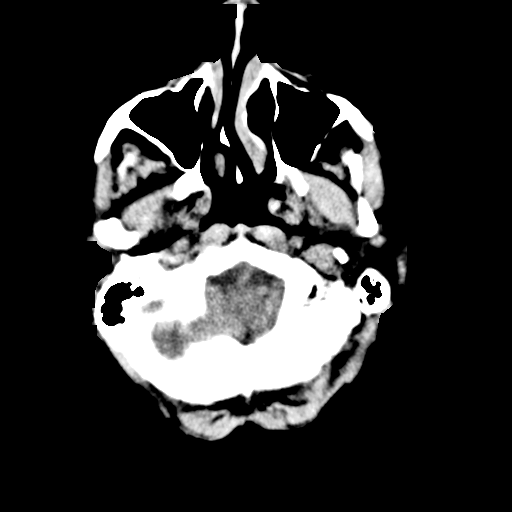
[im 3/37  bone]
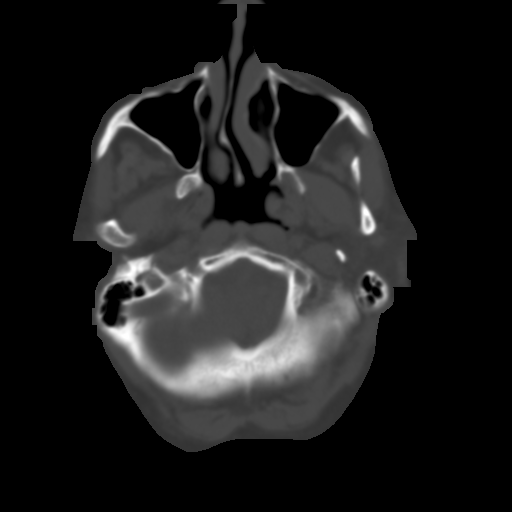
[im 6/37  brain]
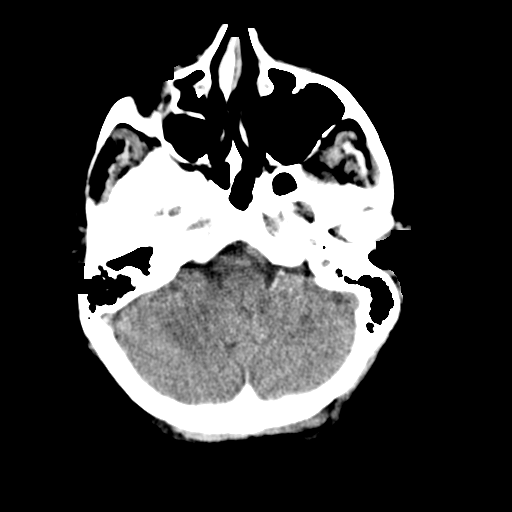
[im 11/37  brain]
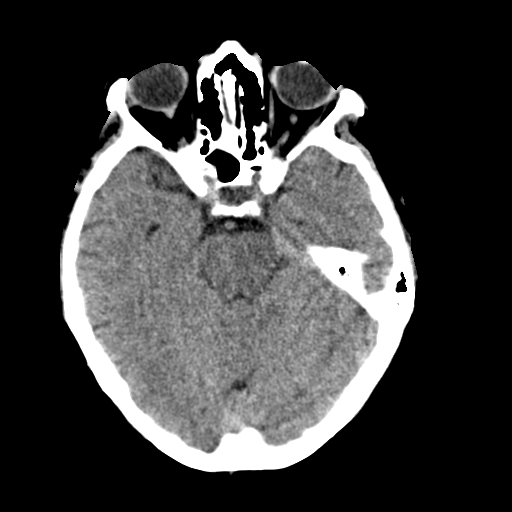
[im 13/37  brain]
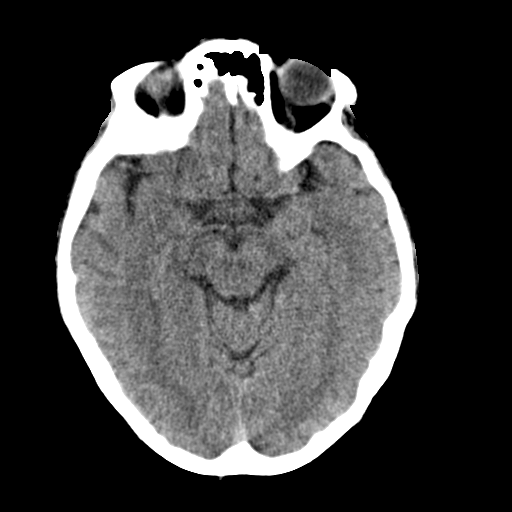
[im 16/37  brain]
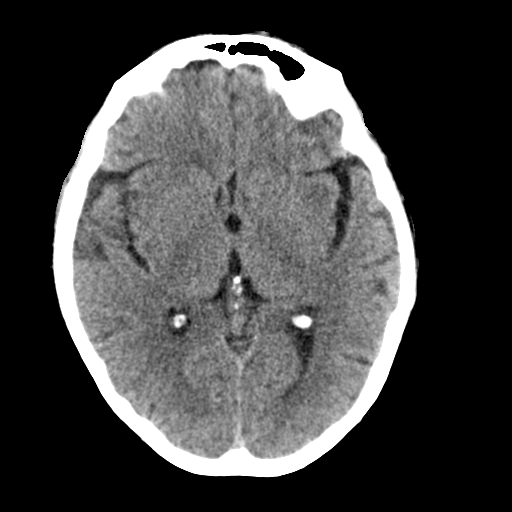
[im 16/37  bone]
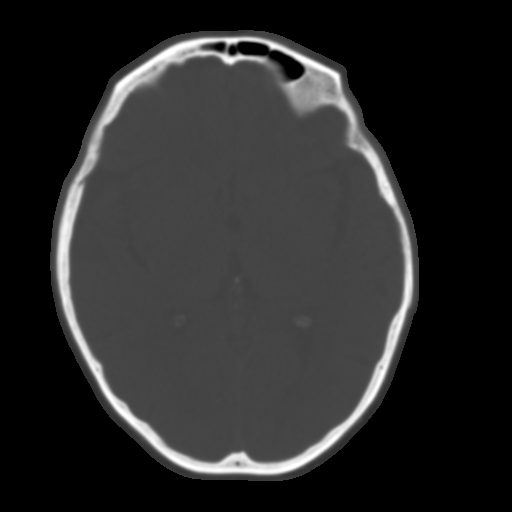
[im 21/37  brain]
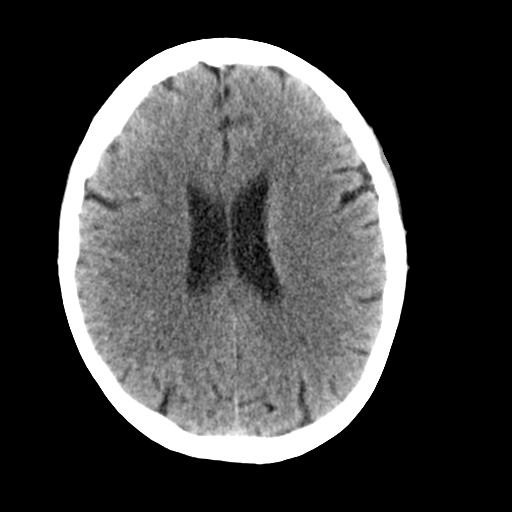
[im 24/37  brain]
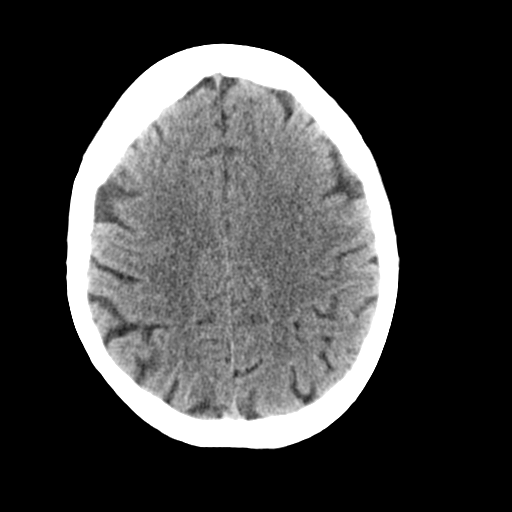
[im 26/37  brain]
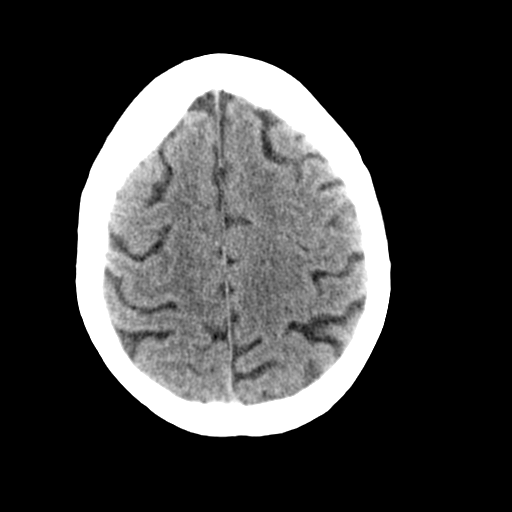
[im 31/37  brain]
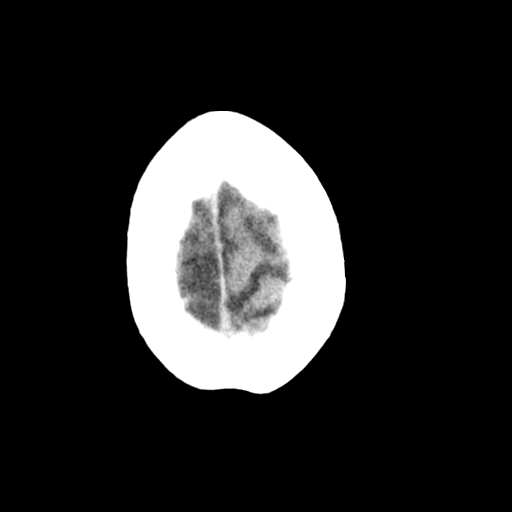
[im 31/37  bone]
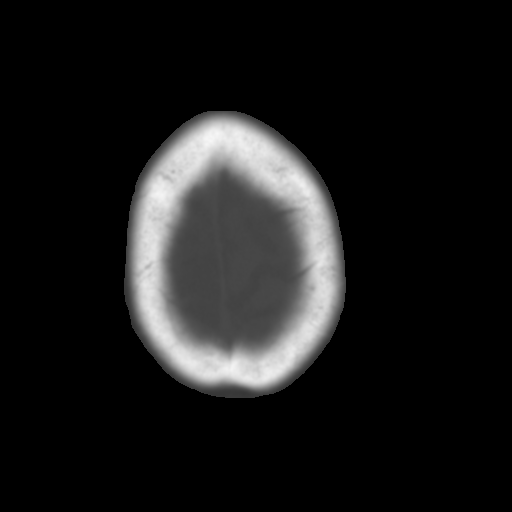
[im 34/37  brain]
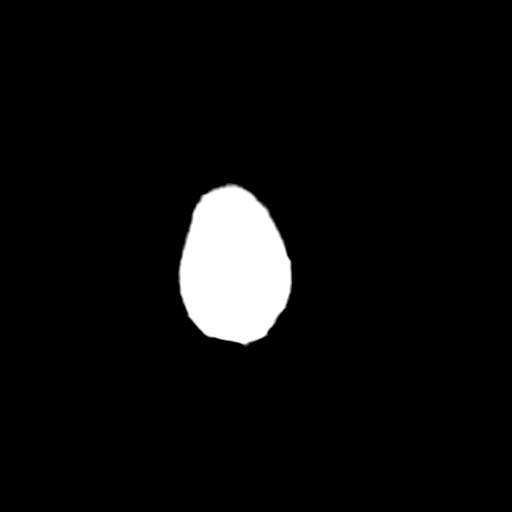

[Series 4: coronal · coronal · 0.30mm/px · 3 of 64 slices shown]
[im 22/64  brain]
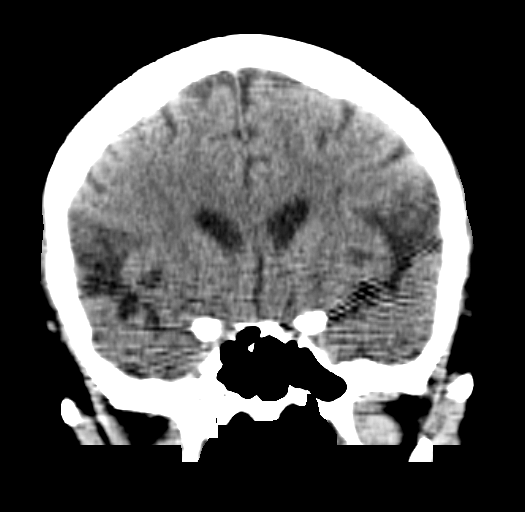
[im 29/64  brain]
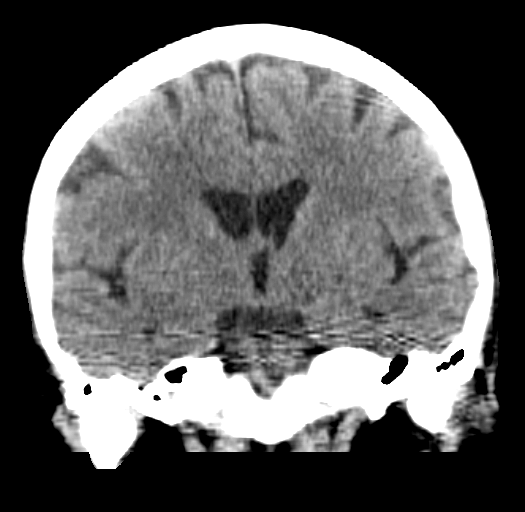
[im 36/64  brain]
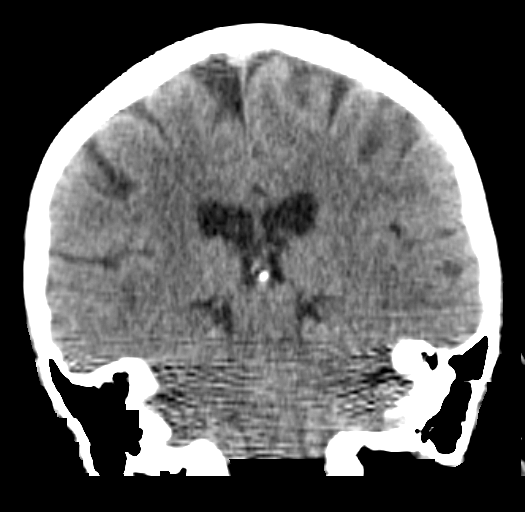

[Series 5: sagittal · sagittal · 0.29mm/px · 3 of 54 slices shown]
[im 18/54  brain]
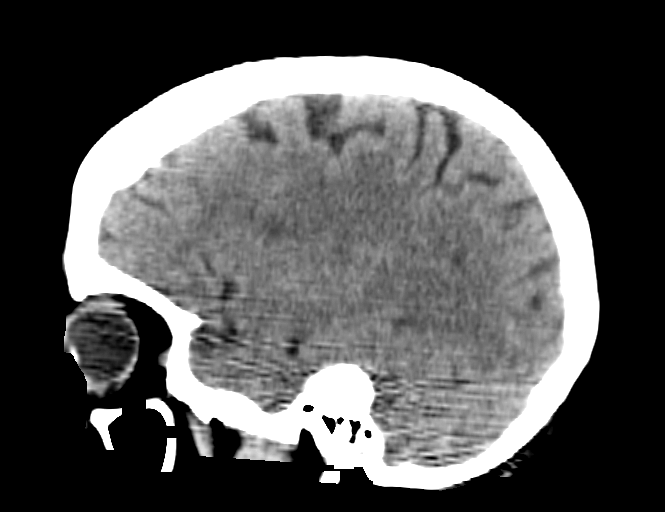
[im 27/54  brain]
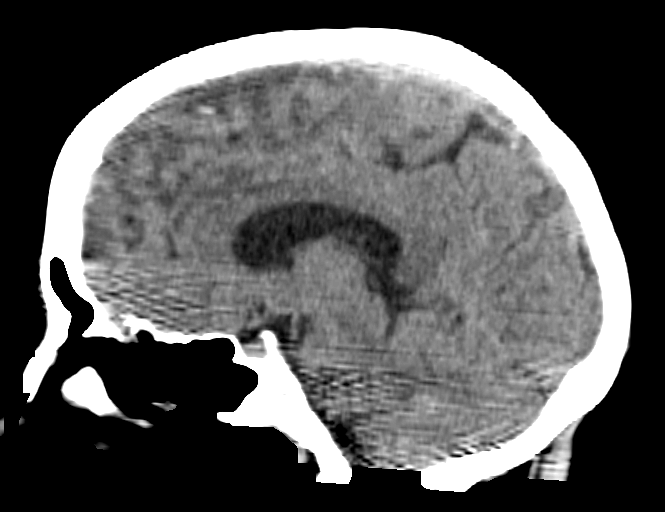
[im 36/54  brain]
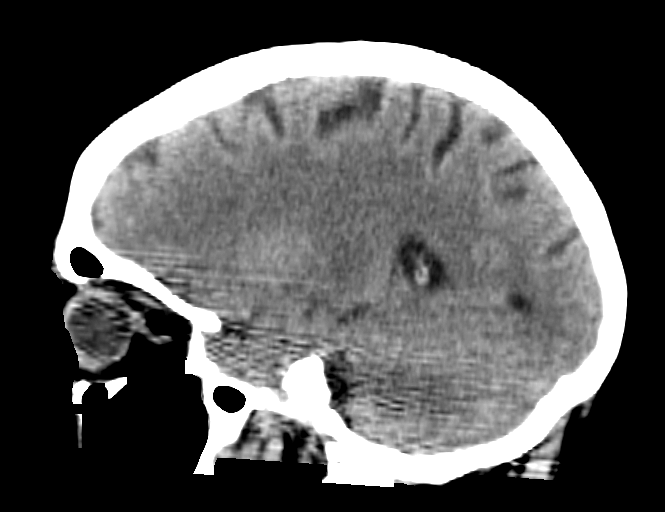

[16 of 47 positions shown; findings below may reference images not displayed]

FINDINGS: Mild atrophy.

Normal ventricular morphology.

No midline shift or mass effect.

Mild small vessel chronic ischemic changes of deep cerebral white
matter.

No intracranial hemorrhage, mass lesion or evidence acute
infarction.

No extra-axial fluid collections.

Bones and sinuses unremarkable.
IMPRESSION: Atrophy with small vessel chronic ischemic changes of deep cerebral
white matter.

No acute intracranial abnormalities.

## 2018-12-29 DIAGNOSIS — Z1231 Encounter for screening mammogram for malignant neoplasm of breast: Secondary | ICD-10-CM | POA: Diagnosis not present

## 2019-02-02 ENCOUNTER — Other Ambulatory Visit: Payer: Self-pay

## 2019-04-23 DIAGNOSIS — Z23 Encounter for immunization: Secondary | ICD-10-CM | POA: Diagnosis not present

## 2019-08-10 DIAGNOSIS — Z23 Encounter for immunization: Secondary | ICD-10-CM | POA: Diagnosis not present

## 2019-09-10 DIAGNOSIS — Z23 Encounter for immunization: Secondary | ICD-10-CM | POA: Diagnosis not present

## 2020-01-16 DIAGNOSIS — Z1231 Encounter for screening mammogram for malignant neoplasm of breast: Secondary | ICD-10-CM | POA: Diagnosis not present

## 2020-02-19 DIAGNOSIS — H43812 Vitreous degeneration, left eye: Secondary | ICD-10-CM | POA: Diagnosis not present

## 2020-05-15 DIAGNOSIS — Z23 Encounter for immunization: Secondary | ICD-10-CM | POA: Diagnosis not present

## 2022-02-23 DIAGNOSIS — Z1231 Encounter for screening mammogram for malignant neoplasm of breast: Secondary | ICD-10-CM | POA: Diagnosis not present

## 2022-09-06 DIAGNOSIS — S338XXA Sprain of other parts of lumbar spine and pelvis, initial encounter: Secondary | ICD-10-CM | POA: Diagnosis not present

## 2022-09-06 DIAGNOSIS — M9903 Segmental and somatic dysfunction of lumbar region: Secondary | ICD-10-CM | POA: Diagnosis not present

## 2022-09-07 DIAGNOSIS — M9903 Segmental and somatic dysfunction of lumbar region: Secondary | ICD-10-CM | POA: Diagnosis not present

## 2022-09-07 DIAGNOSIS — S338XXA Sprain of other parts of lumbar spine and pelvis, initial encounter: Secondary | ICD-10-CM | POA: Diagnosis not present

## 2022-09-10 DIAGNOSIS — M9903 Segmental and somatic dysfunction of lumbar region: Secondary | ICD-10-CM | POA: Diagnosis not present

## 2022-09-10 DIAGNOSIS — S338XXA Sprain of other parts of lumbar spine and pelvis, initial encounter: Secondary | ICD-10-CM | POA: Diagnosis not present

## 2023-03-29 DIAGNOSIS — R92323 Mammographic fibroglandular density, bilateral breasts: Secondary | ICD-10-CM | POA: Diagnosis not present

## 2023-03-29 DIAGNOSIS — Z1231 Encounter for screening mammogram for malignant neoplasm of breast: Secondary | ICD-10-CM | POA: Diagnosis not present

## 2024-03-29 ENCOUNTER — Other Ambulatory Visit (HOSPITAL_BASED_OUTPATIENT_CLINIC_OR_DEPARTMENT_OTHER): Payer: Self-pay

## 2024-03-29 DIAGNOSIS — R300 Dysuria: Secondary | ICD-10-CM | POA: Diagnosis not present

## 2024-03-29 DIAGNOSIS — N390 Urinary tract infection, site not specified: Secondary | ICD-10-CM | POA: Diagnosis not present

## 2024-03-29 MED ORDER — NITROFURANTOIN MONOHYD MACRO 100 MG PO CAPS
100.0000 mg | ORAL_CAPSULE | Freq: Two times a day (BID) | ORAL | 0 refills | Status: AC
  Filled 2024-03-29: qty 10, 5d supply, fill #0
